# Patient Record
Sex: Male | Born: 1995 | Race: Black or African American | Hispanic: No | Marital: Single | Smoking: Never smoker
Health system: Southern US, Community
[De-identification: ages and names within clinical notes are randomized; demographics above are authoritative.]

## PROBLEM LIST (undated history)

## (undated) DIAGNOSIS — J45909 Unspecified asthma, uncomplicated: Secondary | ICD-10-CM

---
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
## 2015-09-24 ENCOUNTER — Emergency Department (HOSPITAL_COMMUNITY)
Admission: EM | Admit: 2015-09-24 | Discharge: 2015-09-24 | Disposition: A | Discharge status: Home / Self Care | Payer: Medicaid Other | Attending: Emergency Medicine | Admitting: Emergency Medicine

## 2015-09-24 ENCOUNTER — Encounter (HOSPITAL_COMMUNITY): Payer: Self-pay

## 2015-09-24 DIAGNOSIS — J45909 Unspecified asthma, uncomplicated: Secondary | ICD-10-CM | POA: Insufficient documentation

## 2015-09-24 DIAGNOSIS — Z202 Contact with and (suspected) exposure to infections with a predominantly sexual mode of transmission: Secondary | ICD-10-CM | POA: Diagnosis present

## 2015-09-24 LAB — URINALYSIS, ROUTINE W REFLEX MICROSCOPIC
Bilirubin Urine: NEGATIVE
GLUCOSE, UA: NEGATIVE mg/dL
Hgb urine dipstick: NEGATIVE
KETONES UR: NEGATIVE mg/dL
LEUKOCYTES UA: NEGATIVE
NITRITE: NEGATIVE
PROTEIN: NEGATIVE mg/dL
Specific Gravity, Urine: 1.03 (ref 1.005–1.030)
pH: 6 (ref 5.0–8.0)

## 2015-09-24 MED ORDER — AZITHROMYCIN 250 MG PO TABS
1000.0000 mg | ORAL_TABLET | Freq: Once | ORAL | Status: AC
  Administered 2015-09-24: 1000 mg via ORAL
  Filled 2015-09-24: qty 4

## 2015-09-24 MED ORDER — CEFTRIAXONE SODIUM 250 MG IJ SOLR
250.0000 mg | Freq: Once | INTRAMUSCULAR | Status: AC
  Administered 2015-09-24: 250 mg via INTRAMUSCULAR
  Filled 2015-09-24: qty 250

## 2015-09-24 NOTE — Discharge Instructions (Signed)
Your urine test was normal. Gonorrhea and chlamydia cultures are pending, however you have already been treated. Follow up with the health dept for further STD needs. Return here for new concerns.
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
## 2015-09-24 NOTE — ED Provider Notes (Signed)
CSN: [id number]     Arrival date & time 09/24/15  1203 History  By signing my name below, I, Freida Busman, attest that this documentation has been prepared under the direction and in the presence of non-physician practitioner, Sharilyn Sites, PA-C. Electronically Signed: Freida Busman, Scribe. 09/24/2015. 12:59 PM.    Chief Complaint  Patient presents with  . Exposure to STD     The history is provided by the patient. No language interpreter was used.     HPI Comments:  Billy Weiss is a 20 y.o. male who presents to the Emergency Department complaining of mild genital discomfort x a few days. He states there is a "funny feeling" in his penis.  He denies h/o similar symptoms. He denies fever, dysuria, rash, penile discharge and penile sores. Pt reports recent new sexual male partner and during the encounter the condom broke.  No alleviating factors noted.  No abdominal pain or testicle pain.   Past Medical History  Diagnosis Date  . Asthma    No past surgical history on file. No family history on file. Social History  Substance Use Topics  . Smoking status: Never Smoker   . Smokeless tobacco: None  . Alcohol Use: No    Review of Systems  Constitutional: Negative for fever and chills.  Gastrointestinal: Negative for nausea and vomiting.  Genitourinary: Negative for dysuria, discharge, genital sores and penile pain.       + Genital discomfort  Skin: Negative for rash.     Allergies  Food  Home Medications   Prior to Admission medications   Medication Sig Start Date End Date Taking? Authorizing Provider  ibuprofen (ADVIL,MOTRIN) 200 MG tablet Take 200 mg by mouth every 6 (six) hours as needed for headache.   Yes Historical Provider, MD   BP 118/75 mmHg  Pulse 72  Temp(Src) 98.2 F (36.8 C) (Oral)  Resp 16  SpO2 100%   Physical Exam  Constitutional: He is oriented to person, place, and time. He appears well-developed and well-nourished. No distress.  HENT:   Head: Normocephalic and atraumatic.  Mouth/Throat: Oropharynx is clear and moist.  Eyes: Conjunctivae and EOM are normal. Pupils are equal, round, and reactive to light.  Neck: Normal range of motion. Neck supple.  Cardiovascular: Normal rate, regular rhythm and normal heart sounds.   Pulmonary/Chest: Effort normal and breath sounds normal. No respiratory distress. He has no wheezes.  Abdominal: Soft. Bowel sounds are normal. There is no tenderness. There is no guarding.  Musculoskeletal: Normal range of motion.  Neurological: He is alert and oriented to person, place, and time.  Skin: Skin is warm and dry. He is not diaphoretic.  Psychiatric: He has a normal mood and affect.  Nursing note and vitals reviewed.   ED Course  Procedures   DIAGNOSTIC STUDIES:  Oxygen Saturation is 100% on RA, normal by my interpretation.    COORDINATION OF CARE:  12:57 PM Will order GC/Chl and treat prophylactically in the ED with Rocephin and Zithromax.   Discussed treatment plan with pt at bedside and pt agreed to plan.  Labs Review Labs Reviewed  URINALYSIS, ROUTINE W REFLEX MICROSCOPIC (NOT AT [facility])  GC/CHLAMYDIA PROBE AMP ([place]) NOT AT [facility]    I have personally reviewed and evaluated these  lab results as part of my medical decision-making.   MDM   Final diagnoses:  Possible exposure to STD   20 year old male here with concern about STD. He states a "funny feeling" in his  penis. He denies any dysuria, urethral discharge, genital sores, abdominal pain, nausea, or vomiting. Patient is afebrile, nontoxic. UA was obtained which is negative.  Gc/chl pending.  Patient treated prophylactically for STDs here in ED with Rocephin and azithromycin. Encouraged to follow-up with the health department for further STD concerns.  Discussed plan with patient, he/she acknowledged understanding and agreed with plan of care.  Return precautions given for new or worsening symptoms.  I personally  performed the services described in this documentation, which was scribed in my presence. The recorded information has been reviewed and is accurate.  Garlon Hatchet, PA-C 09/24/15 1435  Laurence Spates, MD 09/24/15 [phone]

## 2015-09-25 LAB — GC/CHLAMYDIA PROBE AMP (~~LOC~~) NOT AT ARMC
Chlamydia: NEGATIVE
Neisseria Gonorrhea: NEGATIVE

## 2016-04-09 ENCOUNTER — Encounter (HOSPITAL_COMMUNITY): Payer: Self-pay

## 2016-04-09 ENCOUNTER — Emergency Department (HOSPITAL_COMMUNITY): Payer: No Typology Code available for payment source

## 2016-04-09 ENCOUNTER — Emergency Department (HOSPITAL_COMMUNITY)
Admission: EM | Admit: 2016-04-09 | Discharge: 2016-04-09 | Disposition: A | Discharge status: Home / Self Care | Payer: No Typology Code available for payment source | Attending: Emergency Medicine | Admitting: Emergency Medicine

## 2016-04-09 DIAGNOSIS — M542 Cervicalgia: Secondary | ICD-10-CM | POA: Insufficient documentation

## 2016-04-09 DIAGNOSIS — M25511 Pain in right shoulder: Secondary | ICD-10-CM

## 2016-04-09 DIAGNOSIS — Y92410 Unspecified street and highway as the place of occurrence of the external cause: Secondary | ICD-10-CM | POA: Insufficient documentation

## 2016-04-09 DIAGNOSIS — Y939 Activity, unspecified: Secondary | ICD-10-CM | POA: Insufficient documentation

## 2016-04-09 DIAGNOSIS — Y999 Unspecified external cause status: Secondary | ICD-10-CM | POA: Diagnosis not present

## 2016-04-09 DIAGNOSIS — M545 Low back pain, unspecified: Secondary | ICD-10-CM

## 2016-04-09 DIAGNOSIS — J45909 Unspecified asthma, uncomplicated: Secondary | ICD-10-CM | POA: Diagnosis not present

## 2016-04-09 MED ORDER — METHOCARBAMOL 500 MG PO TABS: 500.0000 mg | ORAL_TABLET | Freq: Two times a day (BID) | ORAL | 0 refills | Status: DC

## 2016-04-09 MED ORDER — HYDROCODONE-ACETAMINOPHEN 5-325 MG PO TABS: 1.0000 | ORAL_TABLET | ORAL | 0 refills | Status: DC | PRN

## 2016-04-09 MED ORDER — NAPROXEN 500 MG PO TABS: 500.0000 mg | ORAL_TABLET | Freq: Two times a day (BID) | ORAL | 0 refills | Status: DC

## 2016-04-09 MED ORDER — NAPROXEN 500 MG PO TABS
500.0000 mg | ORAL_TABLET | Freq: Once | ORAL | Status: AC
  Administered 2016-04-09: 500 mg via ORAL
  Filled 2016-04-09: qty 1

## 2016-04-09 MED ORDER — HYDROCODONE-ACETAMINOPHEN 5-325 MG PO TABS
1.0000 | ORAL_TABLET | Freq: Once | ORAL | Status: AC
  Administered 2016-04-09: 1 via ORAL
  Filled 2016-04-09: qty 1

## 2016-04-09 NOTE — ED Notes (Signed)
Notified Ortho tech of order for sling immobilizer.
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
## 2016-04-09 NOTE — Progress Notes (Signed)
Entered in d/c instructions  [facility] PCP - General  [phone] [address] [place] [address]   Next Steps: Schedule an appointment as soon as possible for a visit on 04/09/2016  Instructions: This is your assigned medicaid [place] access doctor If you prefer another pcp please ask for assist from the local DSS office [phone]

## 2017-05-10 ENCOUNTER — Ambulatory Visit (HOSPITAL_COMMUNITY)
Admission: EM | Admit: 2017-05-10 | Discharge: 2017-05-10 | Disposition: A | Discharge status: Home / Self Care | Payer: Self-pay | Attending: Family Medicine | Admitting: Family Medicine

## 2017-05-10 ENCOUNTER — Encounter (HOSPITAL_COMMUNITY): Payer: Self-pay | Admitting: Emergency Medicine

## 2017-05-10 DIAGNOSIS — Z113 Encounter for screening for infections with a predominantly sexual mode of transmission: Secondary | ICD-10-CM

## 2017-05-10 DIAGNOSIS — A749 Chlamydial infection, unspecified: Secondary | ICD-10-CM | POA: Insufficient documentation

## 2017-05-10 DIAGNOSIS — Z202 Contact with and (suspected) exposure to infections with a predominantly sexual mode of transmission: Secondary | ICD-10-CM

## 2017-05-10 DIAGNOSIS — B9689 Other specified bacterial agents as the cause of diseases classified elsewhere: Secondary | ICD-10-CM | POA: Insufficient documentation

## 2017-05-10 MED ORDER — STERILE WATER FOR INJECTION IJ SOLN
INTRAMUSCULAR | Status: AC
  Filled 2017-05-10: qty 10

## 2017-05-10 MED ORDER — CEFTRIAXONE SODIUM 250 MG IJ SOLR
INTRAMUSCULAR | Status: AC
  Filled 2017-05-10: qty 250

## 2017-05-10 MED ORDER — CEFTRIAXONE SODIUM 250 MG IJ SOLR
250.0000 mg | Freq: Once | INTRAMUSCULAR | Status: AC
  Administered 2017-05-10: 250 mg via INTRAMUSCULAR

## 2017-05-10 MED ORDER — AZITHROMYCIN 250 MG PO TABS
ORAL_TABLET | ORAL | Status: AC
  Filled 2017-05-10: qty 4

## 2017-05-10 MED ORDER — AZITHROMYCIN 250 MG PO TABS
1000.0000 mg | ORAL_TABLET | Freq: Once | ORAL | Status: AC
  Administered 2017-05-10: 1000 mg via ORAL

## 2017-05-10 NOTE — ED Provider Notes (Signed)
  [facility]   [id number] 05/10/17 Arrival Time: 1650   SUBJECTIVE:  Billy Weiss is a 21 y.o. male who presents to the urgent care with complaint of exposure to chlamydia. He denies any symptoms, no penile discharge or dysuria. No fever, chills, nausea, vomiting, or other systemic complaints.     Past Medical History:  Diagnosis Date  . Asthma    History reviewed. No pertinent family history. Social History   Social History  . Marital status: Single    Spouse name: N/A  . Number of children: N/A  . Years of education: N/A   Occupational History  . Not on file.   Social History Main Topics  . Smoking status: Never Smoker  . Smokeless tobacco: Never Used  . Alcohol use No  . Drug use: No  . Sexual activity: Not on file   Other Topics Concern  . Not on file   Social History Narrative  . No narrative on file   No outpatient prescriptions have been marked as taking for the 05/10/17 encounter [facility](Hospital Encounter).   Allergies  Allergen Reactions  . Food Hives    Cantaloupe       ROS: As per HPI, remainder of ROS negative.   OBJECTIVE:   Vitals:   05/10/17 1735  BP: 113/73  Pulse: 72  Resp: 18  Temp: 98.4 F (36.9 C)  TempSrc: Oral  SpO2: 99%      General appearance: alert; no distress Eyes: PERRL; EOMI; conjunctiva normal HENT: normocephalic; atraumatic; Neck: supple Lungs: clear to auscultation bilaterally Heart: regular rate and rhythm Abdomen: soft, non-tender;  GU: Deferred, urine cytology obtained Back: no CVA tenderness Extremities: no cyanosis or edema; symmetrical with no gross deformities Skin: warm and dry Neurologic: normal gait; grossly normal Psychological: alert and cooperative; normal mood and affect      Labs:   Labs Reviewed  URINE CYTOLOGY ANCILLARY ONLY    No results found.     ASSESSMENT & PLAN:  1. STD exposure     Meds ordered this encounter  Medications  . azithromycin (ZITHROMAX)  tablet 1,000 mg  . cefTRIAXone (ROCEPHIN) injection 250 mg   Provided counseling on safe sex, return as needed. Reviewed expectations re: course of current medical issues. Questions answered. Outlined signs and symptoms indicating need for more acute intervention. Patient verbalized understanding. After Visit Summary given.    Procedures:        Dorena BodoKennard, Shannette Tabares, NP 05/10/17 Paulo [id number]

## 2017-05-10 NOTE — ED Triage Notes (Signed)
[facility] a [facility] of an exposure to an STD. The patient reported that he was exposed to chlamydia by a former partner. The patient denied any symptoms at this time.

## 2017-05-10 NOTE — ED Notes (Signed)
Dirty urine collected. 

## 2017-05-10 NOTE — Discharge Instructions (Signed)
You have been screened for multiple types of infection diseases such as Gonorrhea, Chlamydia, and trichomonas.You will be notified of the results of these tests in 24-72 hours if positive. If any therapy needs to be started or if your current therapy needs to be changed, you will be notified and and given instructions as to what to do. If your symptoms persist or fail to resolve, follow up with your primary care provider or return to clinic.

## 2017-05-11 LAB — URINE CYTOLOGY ANCILLARY ONLY
CHLAMYDIA, DNA PROBE: POSITIVE — AB
Neisseria Gonorrhea: NEGATIVE
Trichomonas: NEGATIVE

## 2017-07-19 ENCOUNTER — Emergency Department (HOSPITAL_COMMUNITY)
Admission: EM | Admit: 2017-07-19 | Discharge: 2017-07-19 | Disposition: A | Discharge status: Home / Self Care | Payer: Self-pay | Attending: Emergency Medicine | Admitting: Emergency Medicine

## 2017-07-19 ENCOUNTER — Encounter (HOSPITAL_COMMUNITY): Payer: Self-pay | Admitting: Emergency Medicine

## 2017-07-19 ENCOUNTER — Emergency Department (HOSPITAL_COMMUNITY): Payer: Self-pay

## 2017-07-19 DIAGNOSIS — M25512 Pain in left shoulder: Secondary | ICD-10-CM | POA: Insufficient documentation

## 2017-07-19 DIAGNOSIS — J45909 Unspecified asthma, uncomplicated: Secondary | ICD-10-CM | POA: Insufficient documentation

## 2017-07-19 MED ORDER — IBUPROFEN 800 MG PO TABS
800.0000 mg | ORAL_TABLET | Freq: Once | ORAL | Status: AC
  Administered 2017-07-19: 800 mg via ORAL
  Filled 2017-07-19: qty 1

## 2017-07-19 NOTE — Discharge Instructions (Addendum)
Please wear the shoulder immobilizer until you are seen by Dr. Eulah PontMurphy.  Take 600 mg of ibuprofen  with food every 6-8 hours or 650 mg of Tylenol every 6 hours as needed for pain control.  Apply ice to the left shoulder 3-4 times a day for 15-20 minutes.  If you develop any new or worsening symptoms, including if the shoulder pops out of the joint again, if you develop numbness or weakness in the left arm or hand, or other concerning symptoms, please return to the emergency department for reevaluation.

## 2017-07-19 NOTE — ED Provider Notes (Signed)
[facility]-EMERGENCY DEPT Provider Note   CSN: [id number] Arrival date & time: 07/19/17  1550     History   Chief Complaint Chief Complaint  Patient presents with  . Shoulder Pain    HPI Billy Weiss is a 21 y.o. male who presents to the emergency department with a chief complaint of left shoulder pain.  The patient reports that he was boxing yesterday when his shoulder "popped out of socket".  He reports that he was able to "roll it back in" on his own at home.  He presents to the emergency department today with continued left shoulder pain.  He denies numbness, weakness, fever, chills, or recent falls. No left elbow pain.  No treatment prior to arrival.  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 No PMH or daily medication.   The history is provided by the patient. No language interpreter was used.  Shoulder Pain   This is a new problem. The current episode started yesterday. The problem occurs constantly. The problem has been gradually improving. The pain is present in the left shoulder. The quality of the pain is described as sharp. Pertinent negatives include no numbness. He has tried nothing for the symptoms.    Past Medical History:  Diagnosis Date  . Asthma     There are no active problems to display for this patient.   History reviewed. No pertinent surgical history.     Home Medications    Prior to Admission medications   Not on File    Family History No family history on file.  Social History Social History   Tobacco Use  . Smoking status: Never Smoker  . Smokeless tobacco: Never Used  Substance Use Topics  . Alcohol use: No  . Drug use: No     Allergies   Food   Review of Systems Review of Systems  Constitutional: Negative for chills and fever.  Musculoskeletal: Positive for arthralgias and myalgias.  Neurological: Negative for weakness and numbness.   Physical Exam Updated Vital Signs BP  135/74 (BP Location: Right Arm)   Pulse 100   Temp 98.1 F (36.7 C) (Oral)   Resp 16   Ht 5\' 8"  (1.727 m)   Wt 76.7 kg (169 lb)   SpO2 98%   BMI 25.70 kg/m   Physical Exam  Constitutional: He appears well-developed.  HENT:  Head: Normocephalic.  Eyes: Conjunctivae are normal.  Neck: Neck supple.  Cardiovascular: Normal rate and regular rhythm.  No murmur heard. Pulmonary/Chest: Effort normal.  Abdominal: Soft. He exhibits no distension.  Musculoskeletal:  Tender to palpation over the left acromion.  No tenderness to palpation over the left scapula or humerus.  Full range of motion of the left wrist.  Radial pulses are 2+ and symmetric.  Sensation is equal intact throughout.  Normal right shoulder exam.   Neurological: He is alert.  Skin: Skin is warm and dry.  Psychiatric: His behavior is normal.  Nursing note and vitals reviewed.    ED Treatments / Results  Labs (all labs ordered are listed, but only abnormal results are displayed) Labs Reviewed - No data to display  EKG  EKG Interpretation None       Radiology Dg Shoulder Left  Result Date: 07/19/2017 CLINICAL DATA:  21 y/o  M; anterior shoulder pain. EXAM: LEFT SHOULDER - 2+ VIEW COMPARISON:  None. FINDINGS: There is no evidence of fracture or dislocation. There is no evidence of arthropathy or  other focal bone abnormality. Soft tissues are unremarkable. IMPRESSION: Negative. Electronically Signed   By: Mitzi HansenLance  Furusawa-Stratton M.D.   On: 07/19/2017 17:22    Procedures Procedures (including critical care time)  Medications Ordered in ED Medications - No data to display   Initial Impression / Assessment and Plan / ED Course  I have reviewed the triage vital signs and the nursing notes.  Pertinent labs & imaging results that were available during my care of the patient were reviewed by me and considered in my medical decision making (see chart for details).     21 year old male presenting to the  emergency department with left shoulder pain that began yesterday while he was boxing.  He states that he dislocated the shoulder, and was able to reduce it on his own at home.  He presents with continued pain in the emergency department.  Chest x-ray of the left shoulder is unremarkable for dislocation fracture.  On physical exam, he is neurovascularly intact with decreased range of motion of the left shoulder secondary to pain.  He is requesting a work note for his job. Will place the patient in a shoulder immobilizer with follow-up to orthopedics and RICE therapy.  Return precautions given.  No acute distress.  Patient is safe for discharge at this time.  Final Clinical Impressions(s) / ED Diagnoses   Final diagnoses:  Acute pain of left shoulder    ED Discharge Orders    None       Barkley BoardsMcDonald, Mia A, PA-C 07/19/17 1825    Tilden Fossaees, Elizabeth, MD 07/20/17 1413

## 2017-07-19 NOTE — ED Triage Notes (Signed)
Patient c/o left sided shoulder pain since yesterday. Reports "shoulder came out of place yesterday and rolled back in." Denies injury.

## 2018-02-23 ENCOUNTER — Encounter (HOSPITAL_COMMUNITY): Payer: Self-pay | Admitting: Emergency Medicine

## 2018-02-23 ENCOUNTER — Emergency Department (HOSPITAL_COMMUNITY)
Admission: EM | Admit: 2018-02-23 | Discharge: 2018-02-23 | Disposition: A | Discharge status: Home / Self Care | Payer: Self-pay | Attending: Emergency Medicine | Admitting: Emergency Medicine

## 2018-02-23 DIAGNOSIS — M25512 Pain in left shoulder: Secondary | ICD-10-CM | POA: Insufficient documentation

## 2018-02-23 DIAGNOSIS — J45909 Unspecified asthma, uncomplicated: Secondary | ICD-10-CM | POA: Insufficient documentation

## 2018-02-23 DIAGNOSIS — G8929 Other chronic pain: Secondary | ICD-10-CM | POA: Insufficient documentation

## 2018-02-23 DIAGNOSIS — Z202 Contact with and (suspected) exposure to infections with a predominantly sexual mode of transmission: Secondary | ICD-10-CM | POA: Insufficient documentation

## 2018-02-23 MED ORDER — IBUPROFEN 600 MG PO TABS: 600.0000 mg | ORAL_TABLET | Freq: Four times a day (QID) | ORAL | 0 refills | Status: DC | PRN

## 2018-02-23 MED ORDER — CEFTRIAXONE SODIUM 250 MG IJ SOLR
250.0000 mg | Freq: Once | INTRAMUSCULAR | Status: AC
  Administered 2018-02-23: 250 mg via INTRAMUSCULAR
  Filled 2018-02-23: qty 250

## 2018-02-23 MED ORDER — METRONIDAZOLE 500 MG PO TABS
2000.0000 mg | ORAL_TABLET | Freq: Once | ORAL | Status: AC
  Administered 2018-02-23: 2000 mg via ORAL
  Filled 2018-02-23: qty 4

## 2018-02-23 MED ORDER — AZITHROMYCIN 250 MG PO TABS
1000.0000 mg | ORAL_TABLET | Freq: Once | ORAL | Status: AC
  Administered 2018-02-23: 1000 mg via ORAL
  Filled 2018-02-23: qty 4

## 2018-02-23 MED ORDER — STERILE WATER FOR INJECTION IJ SOLN
INTRAMUSCULAR | Status: AC
  Administered 2018-02-23: 10 mL
  Filled 2018-02-23: qty 10

## 2018-02-23 NOTE — ED Notes (Signed)
PT left without receiving d/c paperwork or prescription for motrin. Pt did not get d/c vitals as he left without RN knowledge.  Pt last seen alert and oriented and ambulatory and in no acute distress

## 2018-02-23 NOTE — ED Triage Notes (Signed)
Pt shoulder pain when he sleeps at night. Reports "feels like it wants to pop out". reports had issues with that shoulder year ago

## 2018-02-23 NOTE — ED Triage Notes (Signed)
Pt adds that he has had penile drainage that is whitish. reports one of his sexual partners tested positive for chlamydia, so needs treatment.

## 2018-02-23 NOTE — ED Provider Notes (Signed)
[facility]-EMERGENCY DEPT Provider Note   CSN: [id number] Arrival date & time: 02/23/18  [id number]     History   Chief Complaint Chief Complaint  Patient presents with  . Shoulder Pain  . Exposure to STD    HPI Kelly Splinteratrick T Arline is a 22 y.o. male.  HPI 22 year old male with no significant past medical history here with multiple complaints.  His primary complaint is STD exposure.  He states that his significant other was recently diagnosed with suspected chlamydia.  He states that he was last sexually active with her approximately 4 days ago.  3 days ago, he began to have yellow-green penile discharge and mild dysuria.  He said no testicular pain.  No fevers or chills.  No abdominal pain, nausea, vomiting, or diarrhea.  Patient also complains of chronic left shoulder pain.  He sustained an injury one year ago.  He states that since then, his shoulder has "popped out" at least 4-5 times.  Is been able to get back in.  He noticed some mild pain yesterday, but this is not unusual for him.  He is able to range it through a full range of motion.  Denies any fevers or chills.  No joint swelling or warmth.  This is a chronic issue.  No specific alleviating factors.  Past Medical History:  Diagnosis Date  . Asthma     There are no active problems to display for this patient.   History reviewed. No pertinent surgical history.      Home Medications    Prior to Admission medications   Medication Sig Start Date End Date Taking? Authorizing Provider  ibuprofen (ADVIL,MOTRIN) 600 MG tablet Take 1 tablet (600 mg total) by mouth every 6 (six) hours as needed for moderate pain. 02/23/18   Shaune PollackIsaacs, Elvenia Godden, MD    Family History No family history on file.  Social History Social History   Tobacco Use  . Smoking status: Never Smoker  . Smokeless tobacco: Never Used  Substance Use Topics  . Alcohol use: No  . Drug use: No     Allergies   Food   Review of  Systems Review of Systems  Constitutional: Negative for chills, fatigue and fever.  HENT: Negative for congestion and rhinorrhea.   Eyes: Negative for visual disturbance.  Respiratory: Negative for cough, shortness of breath and wheezing.   Cardiovascular: Negative for chest pain and leg swelling.  Gastrointestinal: Negative for abdominal pain, diarrhea, nausea and vomiting.  Genitourinary: Positive for discharge and penile pain. Negative for dysuria and flank pain.  Musculoskeletal: Positive for arthralgias. Negative for neck pain and neck stiffness.  Skin: Negative for rash and wound.  Allergic/Immunologic: Negative for immunocompromised state.  Neurological: Negative for syncope, weakness and headaches.  All other systems reviewed and are negative.    Physical Exam Updated Vital Signs BP 126/71 (BP Location: Right Arm)   Pulse 88   Temp 98.2 F (36.8 C) (Oral)   Resp 16   Ht 5\' 7"  (1.702 m)   Wt 81.6 kg (180 lb)   SpO2 99%   BMI 28.19 kg/m   Physical Exam  Constitutional: He is oriented to person, place, and time. He appears well-developed and well-nourished. No distress.  HENT:  Head: Normocephalic and atraumatic.  Eyes: Conjunctivae are normal.  Neck: Neck supple.  Cardiovascular: Normal rate, regular rhythm and normal heart sounds.  Pulmonary/Chest: Effort normal. No respiratory distress. He has no wheezes.  Abdominal: He exhibits no distension.  Genitourinary:  Genitourinary Comments: Moderate amount of easily expressed yellow-green penile discharge.  No penile or testicular lesions.  Testes soft and descended bilaterally.  No inguinal lymphadenopathy.  Musculoskeletal: He exhibits no edema.  Mild tenderness over the left upper shoulder.  Passive range of motion is full and painless.  No deformity.  Sensation intact along the lateral deltoid as well as distal extremity.  Strength 5 out of 5.  Compartment soft.  Normal sensation light touch.  Neurological: He is alert  and oriented to person, place, and time. He exhibits normal muscle tone.  Skin: Skin is warm. Capillary refill takes less than 2 seconds. No rash noted.  Nursing note and vitals reviewed.    ED Treatments / Results  Labs (all labs ordered are listed, but only abnormal results are displayed) Labs Reviewed  HIV ANTIBODY (ROUTINE TESTING)  RPR  GC/CHLAMYDIA PROBE AMP ([place]) NOT AT [facility]    EKG None  Radiology No results found.  Procedures Procedures (including critical care time)  Medications Ordered in ED Medications  cefTRIAXone (ROCEPHIN) injection 250 mg (250 mg Intramuscular Given 02/23/18 1104)  metroNIDAZOLE (FLAGYL) tablet 2,000 mg (2,000 mg Oral Given 02/23/18 1105)  azithromycin (ZITHROMAX) tablet 1,000 mg (1,000 mg Oral Given 02/23/18 1106)  sterile water (preservative free) injection (10 mLs  Given 02/23/18 1104)     Initial Impression / Assessment and Plan / ED Course  I have reviewed the triage vital signs and the nursing notes.  Pertinent labs & imaging results that were available during my care of the patient were reviewed by me and considered in my medical decision making (see chart for details).    22 year old male here with intermittent shoulder pain and penile discharge.  Primary complaint is penile discharge was a suspect is related to STD.  No fevers or signs of systemic illness.  No penile lesions.  Will send urine testing, HIV, RPR, and treat empirically.  Patient updated and understands safe sex precautions as well as the need for treatment for his partners.  Regarding his shoulder pain, this is a chronic issue.  He has no bony tenderness and I do not feel imaging would be indicated.  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  I will have him follow-up with an orthopedist.  I discussed the need for regular follow-up for this, as persistent dislocations could lead to chronic pain and  disability.  Final Clinical Impressions(s) / ED Diagnoses   Final diagnoses:  Exposure to STD  Chronic left shoulder pain    ED Discharge Orders        Ordered    ibuprofen (ADVIL,MOTRIN) 600 MG tablet  Every 6 hours PRN     02/23/18 1138       Shaune Pollack, MD 02/23/18 1335

## 2018-02-24 LAB — GC/CHLAMYDIA PROBE AMP (~~LOC~~) NOT AT ARMC
CHLAMYDIA, DNA PROBE: POSITIVE — AB
Neisseria Gonorrhea: POSITIVE — AB

## 2019-02-18 ENCOUNTER — Emergency Department (HOSPITAL_BASED_OUTPATIENT_CLINIC_OR_DEPARTMENT_OTHER): Payer: Self-pay

## 2019-02-18 ENCOUNTER — Other Ambulatory Visit: Payer: Self-pay

## 2019-02-18 ENCOUNTER — Encounter (HOSPITAL_BASED_OUTPATIENT_CLINIC_OR_DEPARTMENT_OTHER): Payer: Self-pay | Admitting: Emergency Medicine

## 2019-02-18 ENCOUNTER — Emergency Department (HOSPITAL_BASED_OUTPATIENT_CLINIC_OR_DEPARTMENT_OTHER)
Admission: EM | Admit: 2019-02-18 | Discharge: 2019-02-18 | Disposition: A | Discharge status: Home / Self Care | Payer: Self-pay | Attending: Emergency Medicine | Admitting: Emergency Medicine

## 2019-02-18 DIAGNOSIS — J45909 Unspecified asthma, uncomplicated: Secondary | ICD-10-CM | POA: Insufficient documentation

## 2019-02-18 DIAGNOSIS — Y92094 Garage of other non-institutional residence as the place of occurrence of the external cause: Secondary | ICD-10-CM | POA: Insufficient documentation

## 2019-02-18 DIAGNOSIS — S60221A Contusion of right hand, initial encounter: Secondary | ICD-10-CM | POA: Insufficient documentation

## 2019-02-18 DIAGNOSIS — Y9389 Activity, other specified: Secondary | ICD-10-CM | POA: Insufficient documentation

## 2019-02-18 DIAGNOSIS — Y998 Other external cause status: Secondary | ICD-10-CM | POA: Insufficient documentation

## 2019-02-18 DIAGNOSIS — W228XXA Striking against or struck by other objects, initial encounter: Secondary | ICD-10-CM | POA: Insufficient documentation

## 2019-02-18 DIAGNOSIS — R2231 Localized swelling, mass and lump, right upper limb: Secondary | ICD-10-CM | POA: Insufficient documentation

## 2019-02-18 DIAGNOSIS — Z23 Encounter for immunization: Secondary | ICD-10-CM | POA: Insufficient documentation

## 2019-02-18 MED ORDER — TETANUS-DIPHTH-ACELL PERTUSSIS 5-2.5-18.5 LF-MCG/0.5 IM SUSP
0.5000 mL | Freq: Once | INTRAMUSCULAR | Status: AC
  Administered 2019-02-18: 0.5 mL via INTRAMUSCULAR
  Filled 2019-02-18: qty 0.5

## 2019-02-18 MED ORDER — CEPHALEXIN 500 MG PO CAPS: 500.0000 mg | ORAL_CAPSULE | Freq: Four times a day (QID) | ORAL | 0 refills | Status: DC

## 2019-02-18 NOTE — ED Notes (Signed)
X-ray at bedside

## 2019-02-18 NOTE — ED Triage Notes (Signed)
Patient states that he was fixing something last night at about midnight and it slipped cutting his right thumb

## 2019-02-18 NOTE — ED Notes (Signed)
ED Provider at bedside. 

## 2019-02-18 NOTE — ED Notes (Signed)
Note for work given 

## 2019-02-18 NOTE — Discharge Instructions (Addendum)
Please see the information and instructions below regarding your visit.  Your diagnoses today include:  1. Traumatic hematoma of right hand, initial encounter     Tests performed today include: X-ray of the affected area that did not show any foreign bodies or broken bones Vital signs. See below for your results today.   Medications prescribed:   Take any prescribed medications only as directed.  Ibuprofen alternating with Tylenol for pain.   Please take all of your antibiotics until finished.   You may develop abdominal discomfort or nausea from the antibiotic. If this occurs, you may take it with food. Some patients also get diarrhea with antibiotics. You may help offset this with probiotics which you can buy or get in yogurt. Do not eat or take the probiotics until 2 hours after your antibiotic.   Some people develop allergies to antibiotics. Symptoms of antibiotic allergy can be mild and include a flat rash and itching. They can also be more serious and include:  ?Hives - Hives are raised, red patches of skin that are usually very itchy.  ?Lip or tongue swelling  ?Trouble swallowing or breathing  ?Blistering of the skin or mouth.  If you have any of these serious symptoms, please seek emergency medical care immediately.   Home care instructions:  Follow any educational materials and wound care instructions contained in this packet.   Keep affected area above the level of your heart when possible to minimize swelling. Wash area gently twice a day with warm soapy water. Do not apply alcohol or hydrogen peroxide directly over a wound. Cover the area if it is draining or weeping. Keep the bandage in place for 24 hours and refrain from getting the wound wet for 24 hours. After that, you may get the area wet, but please ensure that you dry it completely afterwards.  You may apply antibiotic ointment such as Bacitracin or Neosporin.  Please apply ice and compress the area with  bandages.   Follow-up instructions:   Return instructions:  Return to the Emergency Department if you have: Fever Worsening pain Worsening swelling of the wound Pus draining from the wound Redness of the skin that moves away from the wound, especially if it streaks away from the affected area  Any other emergent concerns  Your vital signs today were: BP 120/73 (BP Location: Left Arm)    Pulse 83    Temp 98.4 F (36.9 C) (Oral)    Resp 18    Ht 5\' 7"  (1.702 m)    Wt 88.9 kg    SpO2 100%    BMI 30.70 kg/m  If your blood pressure (BP) was elevated on multiple readings during this visit above 130 for the top number or above 80 for the bottom number, please have this repeated by your primary care provider within one month. --------------  Thank you for allowing [place] to participate in your care today! It was a pleasure taking care of you.

## 2019-02-18 NOTE — ED Provider Notes (Signed)
[facility] EMERGENCY DEPARTMENT Provider Note   CSN: [id number] Arrival date & time: 02/18/19  1742     History   Chief Complaint Chief Complaint  Patient presents with  . Hand Injury    HPI Billy Weiss is a 23 y.o. male.     HPI   Patient is a 23 year old male with past medical history of asthma presenting for right hand injury.  He reports that last night around midnight he was working on something under his car when he hit the back of his hand on a piece of metal.  He reports that he was initially able to move his hand well to make a fist without difficulty.  He reports that he developed a swelling overlying the third MCP joint of the right hand over the past 24 hours and has had difficulty using the hand due to the pain.  Denies any weakness or numbness.  Unknown last tetanus shot.   Past Medical History:  Diagnosis Date  . Asthma     There are no active problems to display for this patient.   History reviewed. No pertinent surgical history.      Home Medications    Prior to Admission medications   Medication Sig Start Date End Date Taking? Authorizing Provider  ibuprofen (ADVIL,MOTRIN) 600 MG tablet Take 1 tablet (600 mg total) by mouth every 6 (six) hours as needed for moderate pain. 02/23/18   Shaune PollackIsaacs, [place], MD    Family History History reviewed. No pertinent family history.  Social History Social History   Tobacco Use  . Smoking status: Never Smoker  . Smokeless tobacco: Never Used  Substance Use Topics  . Alcohol use: No  . Drug use: No     Allergies   Food   Review of Systems Review of Systems  Musculoskeletal: Positive for arthralgias.  Skin: Positive for wound. Negative for color change.  Neurological: Negative for weakness and numbness.     Physical Exam Updated Vital Signs BP 120/73 (BP Location: Left Arm)   Pulse 83   Temp 98.4 F (36.9 C) (Oral)   Resp 18   Ht 5\' 7"  (1.702 m)   Wt 88.9 kg   SpO2 100%    BMI 30.70 kg/m   Physical Exam Vitals signs and nursing note reviewed.  Constitutional:      General: He is not in acute distress.    Appearance: He is well-developed. He is not diaphoretic.     Comments: Sitting comfortably in bed.  HENT:     Head: Normocephalic and atraumatic.  Eyes:     General:        Right eye: No discharge.        Left eye: No discharge.     Conjunctiva/sclera: Conjunctivae normal.     Comments: EOMs normal to gross examination.  Neck:     Musculoskeletal: Normal range of motion.  Cardiovascular:     Rate and Rhythm: Normal rate and regular rhythm.     Comments: Intact, 2+ radial pulse. Pulmonary:     Comments: Patient converses comfortably without audible wheeze or stridor. Abdominal:     General: There is no distension.  Musculoskeletal:        General: Swelling present.     Comments: Patient has a soft tissue swelling over the dorsum of the right hand overlying MCP joint consistent with hematoma.  There is a superficial laceration overlying hematoma.  There is no underlying muscle or tendon exposure.  Patient  is able to flex at MCP joint with difficulty due to pain.  He is able to flex and extend all fingers of right hand without difficulty.  Skin:    General: Skin is warm and dry.  Neurological:     Mental Status: He is alert.     Comments: Cranial nerves intact to gross observation. Patient moves extremities without difficulty.  Psychiatric:        Behavior: Behavior normal.        Thought Content: Thought content normal.        Judgment: Judgment normal.      ED Treatments / Results  Labs (all labs ordered are listed, but only abnormal results are displayed) Labs Reviewed - No data to display  EKG    Radiology Dg Hand Complete Right  Result Date: 02/18/2019 CLINICAL DATA:  Right hand pain and swelling after injury. Pain about the distal third metacarpal. Struck hand on piece of metal while fixing a car. EXAM: RIGHT HAND - COMPLETE 3+  VIEW COMPARISON:  None. FINDINGS: There is no evidence of fracture or dislocation. There is no evidence of arthropathy or other focal bone abnormality. Focal soft tissue prominence overlying the distal metacarpals on the lateral view. No soft tissue air or radiopaque foreign body. IMPRESSION: Focal soft tissue prominence overlying the distal metacarpals on the lateral view, likely hematoma in the setting of injury. No osseous abnormality. Electronically Signed   By: Keith Rake M.D.   On: 02/18/2019 19:00    Procedures Procedures (including critical care time)  Medications Ordered in ED Medications  Tdap (BOOSTRIX) injection 0.5 mL (has no administration in time range)     Initial Impression / Assessment and Plan / ED Course  I have reviewed the triage vital signs and the nursing notes.  Pertinent labs & imaging results that were available during my care of the patient were reviewed by me and considered in my medical decision making (see chart for details).        This is a well-appearing 23 year old male presenting for laceration to the dorsum of his right hand with associated soft tissue swelling.  Examination consistent with hematoma which is also seen on radiograph.  No evidence of underlying fracture.  Given that the patient is presenting 18 hours out from injury, does not amenable to primary closure.  I discussed with the patient that hematoma needs to reabsorb on its own and he should apply ice.  Given the laceration amenable to primary closure, will place patient on Keflex.  He is given to precautions for any increasing pain, swelling, decreased range of motion or erythema.  Patient is in understanding and agrees with plan of care.  Final Clinical Impressions(s) / ED Diagnoses   Final diagnoses:  Traumatic hematoma of right hand, initial encounter    ED Discharge Orders         Ordered    cephALEXin (KEFLEX) 500 MG capsule  4 times daily     02/18/19 1910            Tamala Julian 02/18/19 1913    Margette Fast, MD 02/19/19 [phone]

## 2019-04-12 ENCOUNTER — Emergency Department (HOSPITAL_BASED_OUTPATIENT_CLINIC_OR_DEPARTMENT_OTHER)
Admission: EM | Admit: 2019-04-12 | Discharge: 2019-04-12 | Disposition: A | Discharge status: Home / Self Care | Payer: Self-pay | Attending: Emergency Medicine | Admitting: Emergency Medicine

## 2019-04-12 ENCOUNTER — Encounter (HOSPITAL_BASED_OUTPATIENT_CLINIC_OR_DEPARTMENT_OTHER): Payer: Self-pay | Admitting: Emergency Medicine

## 2019-04-12 DIAGNOSIS — L910 Hypertrophic scar: Secondary | ICD-10-CM | POA: Insufficient documentation

## 2019-04-12 DIAGNOSIS — Z79899 Other long term (current) drug therapy: Secondary | ICD-10-CM | POA: Insufficient documentation

## 2019-04-12 DIAGNOSIS — N342 Other urethritis: Secondary | ICD-10-CM | POA: Insufficient documentation

## 2019-04-12 DIAGNOSIS — Z202 Contact with and (suspected) exposure to infections with a predominantly sexual mode of transmission: Secondary | ICD-10-CM | POA: Insufficient documentation

## 2019-04-12 DIAGNOSIS — J45909 Unspecified asthma, uncomplicated: Secondary | ICD-10-CM | POA: Insufficient documentation

## 2019-04-12 MED ORDER — CEFTRIAXONE SODIUM 250 MG IJ SOLR
250.0000 mg | Freq: Once | INTRAMUSCULAR | Status: AC
  Administered 2019-04-12: 250 mg via INTRAMUSCULAR
  Filled 2019-04-12: qty 250

## 2019-04-12 MED ORDER — AZITHROMYCIN 250 MG PO TABS
1000.0000 mg | ORAL_TABLET | Freq: Once | ORAL | Status: AC
  Administered 2019-04-12: 1000 mg via ORAL
  Filled 2019-04-12: qty 4
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
## 2019-04-12 NOTE — ED Triage Notes (Signed)
Pt is c/o right hand swelling  Pt states he was seen for same before but it got better and now it came back  Pt states it has been like this for a month or so   Pt also wants to be tested for STDs  States on Wednesday he started having burning with urination and a white discharge from his penis

## 2019-04-13 LAB — RPR: RPR Ser Ql: NONREACTIVE

## 2019-04-13 LAB — GC/CHLAMYDIA PROBE AMP (~~LOC~~) NOT AT ARMC
Chlamydia: NEGATIVE
Neisseria Gonorrhea: POSITIVE — AB

## 2019-04-13 LAB — HIV ANTIBODY (ROUTINE TESTING W REFLEX): HIV Screen 4th Generation wRfx: NONREACTIVE

## 2019-05-03 ENCOUNTER — Other Ambulatory Visit: Payer: Self-pay

## 2019-05-03 ENCOUNTER — Emergency Department (HOSPITAL_COMMUNITY)
Admission: EM | Admit: 2019-05-03 | Discharge: 2019-05-03 | Discharge status: Against Medical Advice | Payer: Self-pay | Attending: Emergency Medicine | Admitting: Emergency Medicine

## 2021-02-01 ENCOUNTER — Other Ambulatory Visit: Payer: Self-pay

## 2021-02-01 ENCOUNTER — Emergency Department (HOSPITAL_COMMUNITY)
Admission: EM | Admit: 2021-02-01 | Discharge: 2021-02-01 | Disposition: A | Discharge status: Home / Self Care | Payer: Self-pay | Attending: Emergency Medicine | Admitting: Emergency Medicine

## 2021-02-01 ENCOUNTER — Emergency Department (HOSPITAL_COMMUNITY): Payer: Self-pay

## 2021-02-01 ENCOUNTER — Encounter (HOSPITAL_COMMUNITY): Payer: Self-pay

## 2021-02-01 DIAGNOSIS — S82454B Nondisplaced comminuted fracture of shaft of right fibula, initial encounter for open fracture type I or II: Secondary | ICD-10-CM | POA: Insufficient documentation

## 2021-02-01 DIAGNOSIS — J45909 Unspecified asthma, uncomplicated: Secondary | ICD-10-CM | POA: Insufficient documentation

## 2021-02-01 DIAGNOSIS — Z23 Encounter for immunization: Secondary | ICD-10-CM | POA: Insufficient documentation

## 2021-02-01 DIAGNOSIS — Z20822 Contact with and (suspected) exposure to covid-19: Secondary | ICD-10-CM | POA: Insufficient documentation

## 2021-02-01 DIAGNOSIS — W3400XA Accidental discharge from unspecified firearms or gun, initial encounter: Secondary | ICD-10-CM | POA: Insufficient documentation

## 2021-02-01 DIAGNOSIS — Y92410 Unspecified street and highway as the place of occurrence of the external cause: Secondary | ICD-10-CM | POA: Insufficient documentation

## 2021-02-01 DIAGNOSIS — Y9301 Activity, walking, marching and hiking: Secondary | ICD-10-CM | POA: Insufficient documentation

## 2021-02-01 LAB — COMPREHENSIVE METABOLIC PANEL
ALT: 59 U/L — ABNORMAL HIGH (ref 0–44)
AST: 27 U/L (ref 15–41)
Albumin: 4.7 g/dL (ref 3.5–5.0)
Alkaline Phosphatase: 70 U/L (ref 38–126)
Anion gap: 12 (ref 5–15)
BUN: 17 mg/dL (ref 6–20)
CO2: 21 mmol/L — ABNORMAL LOW (ref 22–32)
Calcium: 9.6 mg/dL (ref 8.9–10.3)
Chloride: 105 mmol/L (ref 98–111)
Creatinine, Ser: 1.1 mg/dL (ref 0.61–1.24)
GFR, Estimated: >60 mL/min (ref >60)
Glucose, Bld: 127 mg/dL — ABNORMAL HIGH (ref 70–99)
Potassium: 3.4 mmol/L — ABNORMAL LOW (ref 3.5–5.1)
Sodium: 138 mmol/L (ref 135–145)
Total Bilirubin: 0.7 mg/dL (ref 0.3–1.2)
Total Protein: 8 g/dL (ref 6.5–8.1)

## 2021-02-01 LAB — RESP PANEL BY RT-PCR (FLU A&B, COVID) ARPGX2
Influenza A by PCR: NEGATIVE
Influenza B by PCR: NEGATIVE
SARS Coronavirus 2 by RT PCR: NEGATIVE

## 2021-02-01 LAB — I-STAT CHEM 8, ED
BUN: 18 mg/dL (ref 6–20)
Calcium, Ion: 1.18 mmol/L (ref 1.15–1.40)
Chloride: 103 mmol/L (ref 98–111)
Creatinine, Ser: 1.1 mg/dL (ref 0.61–1.24)
Glucose, Bld: 140 mg/dL — ABNORMAL HIGH (ref 70–99)
HCT: 43 % (ref 39.0–52.0)
Hemoglobin: 14.6 g/dL (ref 13.0–17.0)
Potassium: 3.2 mmol/L — ABNORMAL LOW (ref 3.5–5.1)
Sodium: 141 mmol/L (ref 135–145)
TCO2: 24 mmol/L (ref 22–32)

## 2021-02-01 LAB — LACTIC ACID, PLASMA: Lactic Acid, Venous: 2 mmol/L (ref 0.5–1.9)

## 2021-02-01 LAB — CBC
HCT: 44.6 % (ref 39.0–52.0)
Hemoglobin: 14.4 g/dL (ref 13.0–17.0)
MCH: 26.9 pg (ref 26.0–34.0)
MCHC: 32.3 g/dL (ref 30.0–36.0)
MCV: 83.2 fL (ref 80.0–100.0)
Platelets: 283 10*3/uL (ref 150–400)
RBC: 5.36 MIL/uL (ref 4.22–5.81)
RDW: 13.2 % (ref 11.5–15.5)
WBC: 16 10*3/uL — ABNORMAL HIGH (ref 4.0–10.5)
nRBC: 0 % (ref 0.0–0.2)

## 2021-02-01 LAB — PROTIME-INR
INR: 1 (ref 0.8–1.2)
Prothrombin Time: 13.3 seconds (ref 11.4–15.2)

## 2021-02-01 LAB — SAMPLE TO BLOOD BANK

## 2021-02-01 LAB — ETHANOL: Alcohol, Ethyl (B): <10 mg/dL (ref <10)

## 2021-02-01 MED ORDER — ONDANSETRON HCL 4 MG/2ML IJ SOLN
4.0000 mg | Freq: Once | INTRAMUSCULAR | Status: AC
  Administered 2021-02-01: 4 mg via INTRAVENOUS
  Filled 2021-02-01: qty 2

## 2021-02-01 MED ORDER — HYDROCODONE-ACETAMINOPHEN 5-325 MG PO TABS: 1.0000 | ORAL_TABLET | ORAL | 0 refills | Status: AC | PRN

## 2021-02-01 MED ORDER — FENTANYL CITRATE (PF) 100 MCG/2ML IJ SOLN
100.0000 ug | Freq: Once | INTRAMUSCULAR | Status: AC
  Administered 2021-02-01: 100 ug via INTRAVENOUS
  Filled 2021-02-01: qty 2

## 2021-02-01 MED ORDER — TETANUS-DIPHTH-ACELL PERTUSSIS 5-2.5-18.5 LF-MCG/0.5 IM SUSY
0.5000 mL | PREFILLED_SYRINGE | Freq: Once | INTRAMUSCULAR | Status: AC
  Administered 2021-02-01: 0.5 mL via INTRAMUSCULAR
  Filled 2021-02-01: qty 0.5

## 2021-02-01 MED ORDER — DOXYCYCLINE HYCLATE 100 MG PO CAPS: 100.0000 mg | ORAL_CAPSULE | Freq: Two times a day (BID) | ORAL | 0 refills | Status: DC

## 2021-02-01 MED ORDER — SODIUM CHLORIDE 0.9 % IV BOLUS
1000.0000 mL | Freq: Once | INTRAVENOUS | Status: AC
  Administered 2021-02-01: 1000 mL via INTRAVENOUS

## 2021-02-01 MED ORDER — CEFAZOLIN SODIUM-DEXTROSE 2-4 GM/100ML-% IV SOLN
2.0000 g | Freq: Once | INTRAVENOUS | Status: AC
  Administered 2021-02-01: 2 g via INTRAVENOUS
  Filled 2021-02-01: qty 100

## 2021-02-01 MED ORDER — IOHEXOL 350 MG/ML SOLN
100.0000 mL | Freq: Once | INTRAVENOUS | Status: AC | PRN
  Administered 2021-02-01: 100 mL via INTRAVENOUS

## 2021-02-01 MED ORDER — SODIUM CHLORIDE 0.9 % IV SOLN: INTRAVENOUS | Status: DC

## 2021-02-01 MED ORDER — HYDROMORPHONE HCL 1 MG/ML IJ SOLN
1.0000 mg | Freq: Once | INTRAMUSCULAR | Status: AC
  Administered 2021-02-01: 1 mg via INTRAVENOUS
  Filled 2021-02-01: qty 1
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
## 2021-02-01 NOTE — Discharge Instructions (Addendum)
Schedule follow-up with Dr. Susa Simmonds for 1 week from today.  He will evaluate the broken bone in your leg.

## 2021-02-01 NOTE — ED Notes (Signed)
Wounds dressed with petroleum gauze, 4x4 and gauze roll. Teach back done with patient and significant other on wound care and follow up appointment. Pt verbalized understanding of prescriptions and restrictions. Pt also verbalized understanding of need for non-weight bearing on R leg.

## 2021-02-01 NOTE — ED Notes (Signed)
Ortho tech called for splint.
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
## 2021-02-01 NOTE — ED Notes (Signed)
Patient transported to CT 

## 2021-11-06 ENCOUNTER — Other Ambulatory Visit (HOSPITAL_BASED_OUTPATIENT_CLINIC_OR_DEPARTMENT_OTHER): Payer: Self-pay

## 2021-11-06 ENCOUNTER — Encounter (HOSPITAL_BASED_OUTPATIENT_CLINIC_OR_DEPARTMENT_OTHER): Payer: Self-pay

## 2021-11-06 ENCOUNTER — Other Ambulatory Visit: Payer: Self-pay

## 2021-11-06 ENCOUNTER — Emergency Department (HOSPITAL_BASED_OUTPATIENT_CLINIC_OR_DEPARTMENT_OTHER)
Admission: EM | Admit: 2021-11-06 | Discharge: 2021-11-06 | Disposition: A | Discharge status: Home / Self Care | Payer: Self-pay | Attending: Emergency Medicine | Admitting: Emergency Medicine

## 2021-11-06 DIAGNOSIS — Z202 Contact with and (suspected) exposure to infections with a predominantly sexual mode of transmission: Secondary | ICD-10-CM | POA: Insufficient documentation

## 2021-11-06 DIAGNOSIS — J45909 Unspecified asthma, uncomplicated: Secondary | ICD-10-CM | POA: Insufficient documentation

## 2021-11-06 LAB — URINALYSIS, MICROSCOPIC (REFLEX)

## 2021-11-06 LAB — URINALYSIS, ROUTINE W REFLEX MICROSCOPIC
Bilirubin Urine: NEGATIVE
Glucose, UA: NEGATIVE mg/dL
Ketones, ur: NEGATIVE mg/dL
Leukocytes,Ua: NEGATIVE
Nitrite: NEGATIVE
Protein, ur: NEGATIVE mg/dL
Specific Gravity, Urine: 1.025 (ref 1.005–1.030)
pH: 7.5 (ref 5.0–8.0)

## 2021-11-06 MED ORDER — LIDOCAINE HCL (PF) 1 % IJ SOLN
INTRAMUSCULAR | Status: AC
  Administered 2021-11-06: 5 mL
  Filled 2021-11-06: qty 5

## 2021-11-06 MED ORDER — DOXYCYCLINE HYCLATE 100 MG PO CAPS
100.0000 mg | ORAL_CAPSULE | Freq: Two times a day (BID) | ORAL | 0 refills | Status: DC
  Filled 2021-11-06: qty 20, 10d supply, fill #0

## 2021-11-06 MED ORDER — DOXYCYCLINE HYCLATE 100 MG PO TABS
100.0000 mg | ORAL_TABLET | Freq: Once | ORAL | Status: AC
  Administered 2021-11-06: 100 mg via ORAL
  Filled 2021-11-06: qty 1

## 2021-11-06 MED ORDER — CEFTRIAXONE SODIUM 500 MG IJ SOLR
500.0000 mg | Freq: Once | INTRAMUSCULAR | Status: AC
  Administered 2021-11-06: 500 mg via INTRAMUSCULAR
  Filled 2021-11-06: qty 500
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
## 2021-11-06 NOTE — Discharge Instructions (Signed)
Take Doxy twice daily for the next 10 days.  Check MyChart for results of gonorrhea committee a test.  You were treated for gonorrhea here, if you have chlamydia you will need to finish the doxycycline.  Follow-up at health department for repeat testing to confirm your negative. ?

## 2021-11-09 LAB — GC/CHLAMYDIA PROBE AMP (~~LOC~~) NOT AT ARMC
Chlamydia: NEGATIVE
Comment: NEGATIVE
Comment: NORMAL
Neisseria Gonorrhea: NEGATIVE
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
## 2023-07-07 ENCOUNTER — Other Ambulatory Visit: Payer: Self-pay

## 2023-07-07 ENCOUNTER — Encounter (HOSPITAL_BASED_OUTPATIENT_CLINIC_OR_DEPARTMENT_OTHER): Payer: Self-pay | Admitting: Emergency Medicine

## 2023-07-07 ENCOUNTER — Emergency Department (HOSPITAL_BASED_OUTPATIENT_CLINIC_OR_DEPARTMENT_OTHER)
Admission: EM | Admit: 2023-07-07 | Discharge: 2023-07-07 | Disposition: A | Discharge status: Home / Self Care | Payer: Self-pay | Attending: Emergency Medicine | Admitting: Emergency Medicine

## 2023-07-07 DIAGNOSIS — J45909 Unspecified asthma, uncomplicated: Secondary | ICD-10-CM | POA: Insufficient documentation

## 2023-07-07 DIAGNOSIS — N3001 Acute cystitis with hematuria: Secondary | ICD-10-CM | POA: Insufficient documentation

## 2023-07-07 DIAGNOSIS — Z202 Contact with and (suspected) exposure to infections with a predominantly sexual mode of transmission: Secondary | ICD-10-CM | POA: Insufficient documentation

## 2023-07-07 LAB — URINALYSIS, ROUTINE W REFLEX MICROSCOPIC
Bilirubin Urine: NEGATIVE
Glucose, UA: NEGATIVE mg/dL
Ketones, ur: NEGATIVE mg/dL
Nitrite: NEGATIVE
Protein, ur: NEGATIVE mg/dL
Specific Gravity, Urine: >=1.03 (ref 1.005–1.030)
pH: 5.5 (ref 5.0–8.0)

## 2023-07-07 LAB — CBC
HCT: 49 % (ref 39.0–52.0)
Hemoglobin: 15.9 g/dL (ref 13.0–17.0)
MCH: 26.5 pg (ref 26.0–34.0)
MCHC: 32.4 g/dL (ref 30.0–36.0)
MCV: 81.8 fL (ref 80.0–100.0)
Platelets: 310 10*3/uL (ref 150–400)
RBC: 5.99 MIL/uL — ABNORMAL HIGH (ref 4.22–5.81)
RDW: 13.5 % (ref 11.5–15.5)
WBC: 12.5 10*3/uL — ABNORMAL HIGH (ref 4.0–10.5)
nRBC: 0 % (ref 0.0–0.2)

## 2023-07-07 LAB — COMPREHENSIVE METABOLIC PANEL
ALT: 34 U/L (ref 0–44)
AST: 27 U/L (ref 15–41)
Albumin: 5 g/dL (ref 3.5–5.0)
Alkaline Phosphatase: 72 U/L (ref 38–126)
Anion gap: 11 (ref 5–15)
BUN: 11 mg/dL (ref 6–20)
CO2: 25 mmol/L (ref 22–32)
Calcium: 9.9 mg/dL (ref 8.9–10.3)
Chloride: 100 mmol/L (ref 98–111)
Creatinine, Ser: 1.04 mg/dL (ref 0.61–1.24)
GFR, Estimated: >60 mL/min (ref >60)
Glucose, Bld: 92 mg/dL (ref 70–99)
Potassium: 3.9 mmol/L (ref 3.5–5.1)
Sodium: 136 mmol/L (ref 135–145)
Total Bilirubin: 0.9 mg/dL (ref <1.2)
Total Protein: 8.8 g/dL — ABNORMAL HIGH (ref 6.5–8.1)

## 2023-07-07 LAB — URINALYSIS, MICROSCOPIC (REFLEX)

## 2023-07-07 LAB — LIPASE, BLOOD: Lipase: 31 U/L (ref 11–51)

## 2023-07-07 MED ORDER — LIDOCAINE HCL (PF) 1 % IJ SOLN
1.0000 mL | Freq: Once | INTRAMUSCULAR | Status: AC
  Administered 2023-07-07: 1 mL
  Filled 2023-07-07: qty 5

## 2023-07-07 MED ORDER — DOXYCYCLINE HYCLATE 100 MG PO TABS: 100.0000 mg | ORAL_TABLET | Freq: Two times a day (BID) | ORAL | 0 refills | Status: AC

## 2023-07-07 MED ORDER — CEFTRIAXONE SODIUM 500 MG IJ SOLR
500.0000 mg | Freq: Once | INTRAMUSCULAR | Status: AC
  Administered 2023-07-07: 500 mg via INTRAMUSCULAR
  Filled 2023-07-07: qty 500

## 2023-07-07 MED ORDER — CEPHALEXIN 500 MG PO CAPS: 500.0000 mg | ORAL_CAPSULE | Freq: Three times a day (TID) | ORAL | 0 refills | Status: AC

## 2023-07-07 NOTE — Discharge Instructions (Addendum)
Please follow up with your STI results on [facility]. Please refrain from sexual intercourse until you have completed treatment regimen  Your urine sample was also concerning for possible urinary tract infection.  You were started on antibiotics to treat this.   It was a pleasure caring for you today in the emergency department.  Please return to the emergency department for any worsening or worrisome symptoms.

## 2023-07-07 NOTE — ED Provider Notes (Signed)
[place] [facility] Provider Note  CSN: [id number] Arrival date & time: 07/07/23 1446  Chief Complaint(s) Abdominal Pain and Exposure to STD  HPI Billy Weiss is a 27 y.o. male with past medical history as below, significant for asthma who presents to the ED with complaint of STD check  Patient reports he had sexual intercourse approximate 2 days ago, he received notification from partner that she was found to have chlamydia and is here for evaluation.  Is not having any dysuria, urgency, hematuria, change in bowel or bladder function.  No nausea or vomiting.  History of prior STD in the past for which she completed treatment for.  He had abd cramping yesterday which has since resolved.  No recent diet or medication changes.  No testicle or scrotal swelling.  No penile swelling or discharge  Past Medical History Past Medical History:  Diagnosis Date   Asthma    There are no problems to display for this patient.  Home Medication(s) Prior to Admission medications   Medication Sig Start Date End Date Taking? Authorizing Provider  cephALEXin (KEFLEX) 500 MG capsule Take 1 capsule (500 mg total) by mouth 3 (three) times daily for 7 days. 07/07/23 07/14/23 Yes Sloan Leiter, DO  doxycycline (VIBRA-TABS) 100 MG tablet Take 1 tablet (100 mg total) by mouth 2 (two) times daily. 07/07/23  Yes Sloan Leiter, DO  HYDROcodone-acetaminophen (NORCO/VICODIN) 5-325 MG tablet Take 1 tablet by mouth every 4 (four) hours as needed for moderate pain. 02/01/21   Gilda Crease, MD                                                                                                                                    Past Surgical History History reviewed. No pertinent surgical history. Family History History reviewed. No pertinent family history.  Social History Social History   Tobacco Use   Smoking status: Never   Smokeless tobacco: Never  Vaping Use    Vaping status: Never Used  Substance Use Topics   Alcohol use: No   Drug use: No   Allergies Food  Review of Systems Review of Systems  Constitutional:  Negative for chills and fever.  Respiratory:  Negative for chest tightness.   Gastrointestinal:  Negative for nausea and vomiting.  Genitourinary:  Negative for penile discharge, penile pain, penile swelling, scrotal swelling and testicular pain.  Musculoskeletal:  Negative for arthralgias.  Skin:  Negative for rash and wound.  All other systems reviewed and are negative.   Physical Exam Vital Signs  I have reviewed the triage vital signs BP 125/84 (BP Location: Left Arm)   Pulse 63   Temp 98.2 F (36.8 C) (Oral)   Resp 20   Ht 5\' 7"  (1.702 m)   Wt 99.8 kg   SpO2 100%   BMI 34.46 kg/m  Physical Exam Vitals and nursing note reviewed.  Constitutional:      General: He is not in acute distress.    Appearance: Normal appearance. He is well-developed. He is not ill-appearing.  HENT:     Head: Normocephalic and atraumatic.     Right Ear: External ear normal.     Left Ear: External ear normal.     Nose: Nose normal.     Mouth/Throat:     Mouth: Mucous membranes are moist.  Eyes:     General: No scleral icterus.       Right eye: No discharge.        Left eye: No discharge.  Cardiovascular:     Rate and Rhythm: Normal rate.  Pulmonary:     Effort: Pulmonary effort is normal. No respiratory distress.     Breath sounds: No stridor.  Abdominal:     General: Abdomen is flat. There is no distension.     Palpations: Abdomen is soft.     Tenderness: There is no abdominal tenderness. There is no guarding or rebound. Negative signs include Murphy's sign and Rovsing's sign.  Musculoskeletal:        General: No deformity.     Cervical back: No rigidity.  Skin:    General: Skin is warm and dry.     Coloration: Skin is not cyanotic, jaundiced or pale.  Neurological:     Mental Status: He is alert and oriented to person,  place, and time.     GCS: GCS eye subscore is 4. GCS verbal subscore is 5. GCS motor subscore is 6.  Psychiatric:        Speech: Speech normal.        Behavior: Behavior normal. Behavior is cooperative.     ED Results and Treatments Labs (all labs ordered are listed, but only abnormal results are displayed) Labs Reviewed  COMPREHENSIVE METABOLIC PANEL - Abnormal; Notable for the following components:      Result Value   Total Protein 8.8 (*)    All other components within normal limits  CBC - Abnormal; Notable for the following components:   WBC 12.5 (*)    RBC 5.99 (*)    All other components within normal limits  URINALYSIS, ROUTINE W REFLEX MICROSCOPIC - Abnormal; Notable for the following components:   APPearance CLOUDY (*)    Hgb urine dipstick TRACE (*)    Leukocytes,Ua SMALL (*)    All other components within normal limits  URINALYSIS, MICROSCOPIC (REFLEX) - Abnormal; Notable for the following components:   Bacteria, UA FEW (*)    All other components within normal limits  URINE CULTURE  LIPASE, BLOOD  RPR  HIV ANTIBODY (ROUTINE TESTING W REFLEX)  GC/CHLAMYDIA PROBE AMP ([place]) NOT AT [facility]                                                                                                                          Radiology No results found.  Pertinent labs & imaging results that were  available during my care of the patient were reviewed by me and considered in my medical decision making (see MDM for details).  Medications Ordered in ED Medications  cefTRIAXone (ROCEPHIN) injection 500 mg (500 mg Intramuscular Given 07/07/23 2024)  lidocaine (PF) (XYLOCAINE) 1 % injection 1-2.1 mL (1 mL Other Given 07/07/23 2024)                                                                                                                                     Procedures Procedures  (including critical care time)  Medical Decision Making / ED Course    Medical Decision  Making:    Billy Weiss is a 27 y.o. male with past medical history as below, significant for asthma who presents to the ED with complaint of STD check. The complaint involves an extensive differential diagnosis and also carries with it a high risk of complications and morbidity.  Serious etiology was considered. Ddx includes but is not limited to: STI, UTI, pyelonephritis, nephrolithiasis, intra-abdominal pathology, etc.  Complete initial physical exam performed, notably the patient  was no acute distress, sitting upright on stretcher, tolerating p.o.    Reviewed and confirmed nursing documentation for past medical history, family history, social history.  Vital signs reviewed.    Clinical Course as of 07/07/23 2127  Thu Jul 07, 2023  2009 UA concerning for UTI [SG]    Clinical Course User Index [SG] Sloan Leiter, DO     Labs reviewed and are stable.  Urinalysis concerning for UTI.  Send culture.  Sent STI testing including HIV, RPR.  He is not having any rash.  He is agreeable to empiric treatment for STI.  Given Rocephin and Doxy Rx and Keflex Rx.  Advised to refrain from sexual intercourse until he is complete antibiotics.  Follow-up with STI results on MyChart  Tolerant p.o., no nausea or vomiting.  Reports he feels at his baseline currently.  Well appearing, nontoxic, stable for discharge   The patient improved significantly and was discharged in stable condition. Detailed discussions were had with the patient regarding current findings, and need for close f/u with PCP or on call doctor. The patient has been instructed to return immediately if the symptoms worsen in any way for re-evaluation. Patient verbalized understanding and is in agreement with current care plan. All questions answered prior to discharge.                   Additional history obtained: -Additional history obtained from na -External records from outside source obtained and reviewed  including: Chart review including previous notes, labs, imaging, consultation notes including  Prior ED visits Prior labs and imaging   Lab Tests: -I ordered, reviewed, and interpreted labs.   The pertinent results include:   Labs Reviewed  COMPREHENSIVE METABOLIC PANEL - Abnormal; Notable for the following components:      Result Value  Total Protein 8.8 (*)    All other components within normal limits  CBC - Abnormal; Notable for the following components:   WBC 12.5 (*)    RBC 5.99 (*)    All other components within normal limits  URINALYSIS, ROUTINE W REFLEX MICROSCOPIC - Abnormal; Notable for the following components:   APPearance CLOUDY (*)    Hgb urine dipstick TRACE (*)    Leukocytes,Ua SMALL (*)    All other components within normal limits  URINALYSIS, MICROSCOPIC (REFLEX) - Abnormal; Notable for the following components:   Bacteria, UA FEW (*)    All other components within normal limits  URINE CULTURE  LIPASE, BLOOD  RPR  HIV ANTIBODY (ROUTINE TESTING W REFLEX)  GC/CHLAMYDIA PROBE AMP ([place]) NOT AT [facility]    Notable for UTI  EKG   EKG Interpretation Date/Time:    Ventricular Rate:    PR Interval:    QRS Duration:    QT Interval:    QTC Calculation:   R Axis:      Text Interpretation:           Imaging Studies ordered: na   Medicines ordered and prescription drug management: Meds ordered this encounter  Medications   cefTRIAXone (ROCEPHIN) injection 500 mg    Order Specific Question:   Antibiotic Indication:    Answer:   STD   lidocaine (PF) (XYLOCAINE) 1 % injection 1-2.1 mL   doxycycline (VIBRA-TABS) 100 MG tablet    Sig: Take 1 tablet (100 mg total) by mouth 2 (two) times daily.    Dispense:  14 tablet    Refill:  0   cephALEXin (KEFLEX) 500 MG capsule    Sig: Take 1 capsule (500 mg total) by mouth 3 (three) times daily for 7 days.    Dispense:  21 capsule    Refill:  0    -I have reviewed the patients home medicines and have  made adjustments as needed   Consultations Obtained: na   Cardiac Monitoring: Continuous pulse oximetry interpreted by myself, 100% on RA.    Social Determinants of Health:  Diagnosis or treatment significantly limited by social determinants of health: obesity, no insurance   Reevaluation: After the interventions noted above, I reevaluated the patient and found that they have resolved  Co morbidities that complicate the patient evaluation  Past Medical History:  Diagnosis Date   Asthma       Dispostion: Disposition decision including need for hospitalization was considered, and patient discharged from emergency department.    Final Clinical Impression(s) / ED Diagnoses Final diagnoses:  Exposure to STD  Acute cystitis with hematuria        Sloan Leiter, DO 07/07/23 2127

## 2023-07-07 NOTE — ED Triage Notes (Signed)
Pt POV steady gait- c/o lower abd pain x1 day.  Denies n/v/d.  Good po intake, LBM last night, normal. Denies known sick contact.   Also reports possible STI exposure, requesting testing, denies sx.

## 2023-07-08 LAB — HIV ANTIBODY (ROUTINE TESTING W REFLEX): HIV Screen 4th Generation wRfx: NONREACTIVE

## 2023-07-08 LAB — URINE CULTURE: Culture: NO GROWTH

## 2023-07-08 LAB — RPR: RPR Ser Ql: NONREACTIVE

## 2023-07-11 LAB — GC/CHLAMYDIA PROBE AMP (~~LOC~~) NOT AT ARMC
Chlamydia: POSITIVE — AB
Comment: NEGATIVE
Comment: NORMAL
Neisseria Gonorrhea: NEGATIVE
